# Patient Record
Sex: Male | Born: 1995 | Race: Black or African American | Hispanic: No | Marital: Single | Smoking: Current every day smoker
Health system: Southern US, Community
[De-identification: ages and names within clinical notes are randomized; demographics above are authoritative.]

## PROBLEM LIST (undated history)

## (undated) ENCOUNTER — Ambulatory Visit (HOSPITAL_COMMUNITY): Admission: EM | Payer: Self-pay | Source: Home / Self Care

## (undated) ENCOUNTER — Ambulatory Visit (HOSPITAL_COMMUNITY): Disposition: A | Discharge status: Home / Self Care | Payer: Self-pay

## (undated) DIAGNOSIS — R040 Epistaxis: Secondary | ICD-10-CM

---
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
## 2016-01-02 ENCOUNTER — Emergency Department (HOSPITAL_COMMUNITY)
Admission: EM | Admit: 2016-01-02 | Discharge: 2016-01-02 | Disposition: A | Discharge status: Home / Self Care | Payer: Medicaid Other | Attending: Emergency Medicine | Admitting: Emergency Medicine

## 2016-01-02 ENCOUNTER — Encounter (HOSPITAL_COMMUNITY): Payer: Self-pay | Admitting: Emergency Medicine

## 2016-01-02 DIAGNOSIS — Z202 Contact with and (suspected) exposure to infections with a predominantly sexual mode of transmission: Secondary | ICD-10-CM | POA: Diagnosis not present

## 2016-01-02 LAB — URINALYSIS, ROUTINE W REFLEX MICROSCOPIC
Bilirubin Urine: NEGATIVE
Glucose, UA: NEGATIVE mg/dL
Hgb urine dipstick: NEGATIVE
Ketones, ur: NEGATIVE mg/dL
Nitrite: NEGATIVE
PROTEIN: NEGATIVE mg/dL
Specific Gravity, Urine: 1.015 (ref 1.005–1.030)
pH: 7.5 (ref 5.0–8.0)

## 2016-01-02 LAB — URINE MICROSCOPIC-ADD ON

## 2016-01-02 MED ORDER — LIDOCAINE HCL (PF) 1 % IJ SOLN
5.0000 mL | Freq: Once | INTRAMUSCULAR | Status: AC
  Administered 2016-01-02: 5 mL
  Filled 2016-01-02: qty 5

## 2016-01-02 MED ORDER — AZITHROMYCIN 250 MG PO TABS
1000.0000 mg | ORAL_TABLET | Freq: Once | ORAL | Status: AC
  Administered 2016-01-02: 1000 mg via ORAL
  Filled 2016-01-02: qty 4

## 2016-01-02 MED ORDER — CEFTRIAXONE SODIUM 250 MG IJ SOLR
250.0000 mg | Freq: Once | INTRAMUSCULAR | Status: AC
  Administered 2016-01-02: 250 mg via INTRAMUSCULAR
  Filled 2016-01-02: qty 250

## 2016-01-02 MED ORDER — FEXOFENADINE HCL 60 MG PO TABS: 60.0000 mg | ORAL_TABLET | Freq: Every day | ORAL | 0 refills | Status: DC

## 2016-01-02 MED ORDER — ONDANSETRON 4 MG PO TBDP
8.0000 mg | ORAL_TABLET | Freq: Once | ORAL | Status: AC
  Administered 2016-01-02: 8 mg via ORAL
  Filled 2016-01-02: qty 2

## 2016-01-02 NOTE — ED Notes (Signed)
Pt given snack and beverage. 

## 2016-01-02 NOTE — ED Notes (Signed)
Pt requesting refill on his Allegra.

## 2016-01-02 NOTE — ED Triage Notes (Signed)
Pt was told yesterday that he needed to come in for possible std exposure. Denies any symptoms.

## 2016-01-02 NOTE — ED Provider Notes (Addendum)
[facility]-EMERGENCY DEPT Provider Note   CSN: [id number] Arrival date & time: 01/02/16  1141  By signing my name below, I, Majel HomerPeyton Lee, attest that this documentation has been prepared under the direction and in the presence of non-physician practitioner, Roxy Horsemanobert Angele Wiemann, PA-C. Electronically Signed: Majel HomerPeyton Lee, Scribe. 01/02/2016. 11:57 AM.  History   Chief Complaint Chief Complaint  Patient presents with  . Exposure to STD   The history is provided by the patient. No language interpreter was used.   HPI Comments: Jorge Stephenson is a 20 y.o. male who presents to the Emergency Department with a chief complaint of possible STD exposure to chlamydia. Pt reports he was told of possible exposure to this yesterday and visited the ED for an evaluation. He denies any penile pain, testicular pain, abnormal discharge and masses or sores on his penis.   Past Medical History:  Diagnosis Date  . Epistaxis    There are no active problems to display for this patient.  Past Surgical History:  Procedure Laterality Date  . CLOSED REDUCTION FINGER WITH PERCUTANEOUS PINNING Right 11/05/2013   Procedure: CLOSED REDUCTION FINGER WITH PERCUTANEOUS PINNING ;  Surgeon: Betha LoaKevin Kuzma, MD;  Location: [facility];  Service: Orthopedics;  Laterality: Right;    Home Medications    Prior to Admission medications   Medication Sig Start Date End Date Taking? Authorizing Provider  cetirizine (ZYRTEC ALLERGY) 10 MG tablet Take 1 tablet (10 mg total) by mouth daily. 04/20/15   Tatyana Kirichenko, PA-C  fluticasone (FLONASE) 50 MCG/ACT nasal spray Place 1 spray into both nostrils daily. 04/20/15   Jaynie Crumbleatyana Kirichenko, PA-C  HYDROcodone-acetaminophen (NORCO) 5-325 MG per tablet 1-2 tabs po q6 hours prn pain 11/05/13   Betha LoaKevin Kuzma, MD  ibuprofen (ADVIL,MOTRIN) 600 MG tablet Take 1 tablet (600 mg total) by mouth every 6 (six) hours as needed. 04/20/15   Tatyana Kirichenko, PA-C  ondansetron (ZOFRAN) 4 MG tablet  Take 1 tablet (4 mg total) by mouth every 8 (eight) hours as needed for nausea or vomiting. 07/06/13   Mellody DrownLauren Parker, PA-C  permethrin (ELIMITE) 5 % cream Apply to the body once 02/01/13   Rodolph BongEvan S Corey, MD  pseudoephedrine (SUDAFED) 30 MG tablet Take 1 tablet (30 mg total) by mouth every 4 (four) hours as needed for congestion. 04/20/15   Jaynie Crumbleatyana Kirichenko, PA-C    Family History No family history on file.  Social History Social History  Substance Use Topics  . Smoking status: Never Smoker  . Smokeless tobacco: Not on file  . Alcohol use No     Allergies   Patient has no known allergies.   Review of Systems Review of Systems  Constitutional: Negative for fever.  Genitourinary: Negative for discharge, genital sores, penile pain and testicular pain.   Physical Exam Updated Vital Signs BP 122/75 (BP Location: Left Arm)   Pulse 75   Temp 98 F (36.7 C) (Oral)   Resp 16   SpO2 100%   Physical Exam  Constitutional: He is oriented to person, place, and time. He appears well-developed and well-nourished.  HENT:  Head: Normocephalic and atraumatic.  Eyes: Conjunctivae and EOM are normal.  Neck: Normal range of motion.  Cardiovascular: Normal rate.   Pulmonary/Chest: Effort normal.  Abdominal: He exhibits no distension.  Genitourinary: Penis normal.  Genitourinary Comments: No discharge, masses, lesions, or abnormality  Musculoskeletal: Normal range of motion.  Neurological: He is alert and oriented to person, place, and time.  Skin: Skin is dry.  Psychiatric: He has a normal mood and affect. His behavior is normal. Judgment and thought content normal.  Nursing note and vitals reviewed.    ED Treatments / Results  Labs (all labs ordered are listed, but only abnormal results are displayed) Labs Reviewed - No data to display  EKG  EKG Interpretation None       Radiology No results found.  Procedures Procedures (including critical care time)  Medications  Ordered in ED Medications - No data to display  DIAGNOSTIC STUDIES:  Oxygen Saturation is 100% on RA, normal by my interpretation.    COORDINATION OF CARE:  12:16 PM Discussed treatment plan with pt at bedside and pt agreed to plan.  Initial Impression / Assessment and Plan / ED Course  I have reviewed the triage vital signs and the nursing notes.  Pertinent labs & imaging results that were available during my care of the patient were reviewed by me and considered in my medical decision making (see chart for details).  Clinical Course     Patient with exposure to STD.  WBCs in urine, no dysuria.  Will treat with rocephin and azithro.  I personally performed the services described in this documentation, which was scribed in my presence. The recorded information has been reviewed and is accurate.   Final Clinical Impressions(s) / ED Diagnoses   Final diagnoses:  STD exposure    New Prescriptions Discharge Medication List as of 01/02/2016 12:59 PM       Roxy Horsemanobert Kayton Ripp, PA-C 01/02/16 1355    Rolland PorterMark James, MD 01/23/16 1527    Roxy Horsemanobert Wanetta Funderburke, PA-C 01/31/16 [id number]    Rolland PorterMark James, MD 02/11/16 1137

## 2016-01-03 LAB — GC/CHLAMYDIA PROBE AMP (~~LOC~~) NOT AT ARMC
Chlamydia: POSITIVE — AB
NEISSERIA GONORRHEA: NEGATIVE

## 2016-01-05 ENCOUNTER — Encounter (HOSPITAL_COMMUNITY): Payer: Self-pay | Admitting: Emergency Medicine

## 2017-03-21 ENCOUNTER — Encounter (HOSPITAL_COMMUNITY): Payer: Self-pay | Admitting: Emergency Medicine

## 2017-03-21 ENCOUNTER — Emergency Department (HOSPITAL_COMMUNITY)
Admission: EM | Admit: 2017-03-21 | Discharge: 2017-03-21 | Disposition: A | Discharge status: Home / Self Care | Payer: Self-pay | Attending: Emergency Medicine | Admitting: Emergency Medicine

## 2017-03-21 DIAGNOSIS — S39012A Strain of muscle, fascia and tendon of lower back, initial encounter: Secondary | ICD-10-CM | POA: Insufficient documentation

## 2017-03-21 DIAGNOSIS — Y929 Unspecified place or not applicable: Secondary | ICD-10-CM | POA: Insufficient documentation

## 2017-03-21 DIAGNOSIS — Y9389 Activity, other specified: Secondary | ICD-10-CM | POA: Insufficient documentation

## 2017-03-21 DIAGNOSIS — Y999 Unspecified external cause status: Secondary | ICD-10-CM | POA: Insufficient documentation

## 2017-03-21 DIAGNOSIS — Z79899 Other long term (current) drug therapy: Secondary | ICD-10-CM | POA: Insufficient documentation

## 2017-03-21 DIAGNOSIS — X501XXA Overexertion from prolonged static or awkward postures, initial encounter: Secondary | ICD-10-CM | POA: Insufficient documentation

## 2017-03-21 MED ORDER — KETOROLAC TROMETHAMINE 60 MG/2ML IM SOLN
60.0000 mg | Freq: Once | INTRAMUSCULAR | Status: AC
  Administered 2017-03-21: 60 mg via INTRAMUSCULAR
  Filled 2017-03-21: qty 2

## 2017-03-21 MED ORDER — DEXAMETHASONE SODIUM PHOSPHATE 10 MG/ML IJ SOLN
10.0000 mg | Freq: Once | INTRAMUSCULAR | Status: AC
  Administered 2017-03-21: 10 mg via INTRAMUSCULAR
  Filled 2017-03-21: qty 1

## 2017-03-21 MED ORDER — BACLOFEN 10 MG PO TABS: 10.0000 mg | ORAL_TABLET | Freq: Three times a day (TID) | ORAL | 0 refills | Status: DC

## 2017-03-21 MED ORDER — MELOXICAM 15 MG PO TABS: 15.0000 mg | ORAL_TABLET | Freq: Every day | ORAL | 0 refills | Status: DC

## 2017-03-21 MED FILL — BACLOFEN 10 MG TABS: 10 | 10 days supply | Qty: 30 | Fill #0

## 2017-03-21 MED FILL — MELOXICAM 15 MG TABLET: 15 | 30 days supply | Qty: 30 | Fill #0

## 2017-03-21 NOTE — ED Notes (Signed)
Bed: WTR8 Expected date:  Expected time:  Means of arrival:  Comments: 

## 2017-03-21 NOTE — Discharge Instructions (Signed)
Do not start taking your Mobic until tomorrow. If you need more pain control before that time please take 500-650mg  of tylenol tonight. SEEK IMMEDIATE MEDICAL ATTENTION IF: New numbness, tingling, weakness, or problem with the use of your arms or legs.  Severe back pain not relieved with medications.  Change in bowel or bladder control.  Increasing pain in any areas of the body (such as chest or abdominal pain).  Shortness of breath, dizziness or fainting.  Nausea (feeling sick to your stomach), vomiting, fever, or sweats.

## 2017-03-21 NOTE — ED Triage Notes (Signed)
Per [facility] patient from home for lower back pain that started last night after bending over. Reports pain is worse with movement.

## 2017-03-21 NOTE — ED Provider Notes (Signed)
[facility]-EMERGENCY DEPT Provider Note   CSN: [id number] Arrival date & time: 03/21/17  1029     History   Chief Complaint Chief Complaint  Patient presents with  . Back Pain    HPI Jorge Stephenson is a 22 y.o. male.  Back Pain: Patient presents for presents evaluation of low back problems.  Symptoms have been present for 1 day and include pain in Lumbar region worse on the R (aching in character; 8/10 in severity). Initial inciting event: Patient bent over to reach something and felt his back stiffen up..  Alleviating factors identifiable by patient are staying still . Exacerbating factors identifiable by patient are any movement. Treatments so far initiated by patient: none Previous lower back problems: none. Previous workup: none. Previous treatments: none. Denies weakness, loss of bowel/bladder function or saddle anesthesia. Denies neck stiffness, headache, rash.  Denies fever or recent procedures to back. Pain does not radiate.   HPI  Past Medical History:  Diagnosis Date  . Epistaxis     There are no active problems to display for this patient.   Past Surgical History:  Procedure Laterality Date  . CLOSED REDUCTION FINGER WITH PERCUTANEOUS PINNING Right 11/05/2013   Procedure: CLOSED REDUCTION FINGER WITH PERCUTANEOUS PINNING ;  Surgeon: Betha Loa, MD;  Location: [facility];  Service: Orthopedics;  Laterality: Right;       Home Medications    Prior to Admission medications   Medication Sig Start Date End Date Taking? Authorizing Provider  baclofen (LIORESAL) 10 MG tablet Take 1 tablet (10 mg total) by mouth 3 (three) times daily. 03/21/17   Arthor Captain, PA-C  cetirizine (ZYRTEC ALLERGY) 10 MG tablet Take 1 tablet (10 mg total) by mouth daily. 04/20/15   Kirichenko, Tatyana, PA-C  fexofenadine (ALLEGRA) 60 MG tablet Take 1 tablet (60 mg total) by mouth daily. 01/02/16   Roxy Horseman, PA-C  fluticasone (FLONASE) 50  MCG/ACT nasal spray Place 1 spray into both nostrils daily. 04/20/15   Kirichenko, Lemont Fillers, PA-C  HYDROcodone-acetaminophen (NORCO) 5-325 MG per tablet 1-2 tabs po q6 hours prn pain 11/05/13   Betha Loa, MD  ibuprofen (ADVIL,MOTRIN) 600 MG tablet Take 1 tablet (600 mg total) by mouth every 6 (six) hours as needed. 04/20/15   Kirichenko, Lemont Fillers, PA-C  meloxicam (MOBIC) 15 MG tablet Take 1 tablet (15 mg total) by mouth daily. 03/21/17   Arthor Captain, PA-C  ondansetron (ZOFRAN) 4 MG tablet Take 1 tablet (4 mg total) by mouth every 8 (eight) hours as needed for nausea or vomiting. 07/06/13   Mellody Drown, PA-C  permethrin Verner Mould) 5 % cream Apply to the body once 02/01/13   Rodolph Bong, MD  pseudoephedrine (SUDAFED) 30 MG tablet Take 1 tablet (30 mg total) by mouth every 4 (four) hours as needed for congestion. 04/20/15   Jaynie Crumble, PA-C    Family History No family history on file.  Social History Social History   Tobacco Use  . Smoking status: Never Smoker  . Smokeless tobacco: Never Used  Substance Use Topics  . Alcohol use: No  . Drug use: Yes    Types: Marijuana     Allergies   Patient has no known allergies.   Review of Systems Review of Systems Ten systems reviewed and are negative for acute change, except as noted in the HPI.   Physical Exam Updated Vital Signs BP (!) 122/94 (BP Location: Right Arm)   Pulse 74   Temp 97.8 F (  36.6 C) (Oral)   Resp 16   SpO2 100%   Physical Exam Physical Exam  Nursing note and vitals reviewed. Constitutional: He appears well-developed and well-nourished. No distress.  HENT:  Head: Normocephalic and atraumatic.  Eyes: Conjunctivae normal are normal. No scleral icterus.  Neck: Normal range of motion. Neck supple.  Cardiovascular: Normal rate, regular rhythm and normal heart sounds.   Pulmonary/Chest: Effort normal and breath sounds normal. No respiratory distress.  Abdominal: Soft. There is no tenderness.    Musculoskeletal: He exhibits no edema.  Patient appears to be in mild to moderate pain, antalgic gait noted. Lumbosacral spine area reveals no local tenderness or mass. Painful and reduced LS ROM noted. Straight leg raise is negative. DTR's, motor strength and sensation normal, including heel and toe gait.  Peripheral pulses are palpable. Neurological: He is alert.  Skin: Skin is warm and dry. He is not diaphoretic.  Psychiatric: His behavior is normal.     ED Treatments / Results  Labs (all labs ordered are listed, but only abnormal results are displayed) Labs Reviewed - No data to display  EKG  EKG Interpretation None       Radiology No results found.  Procedures Procedures (including critical care time)  Medications Ordered in ED Medications  ketorolac (TORADOL) injection 60 mg (60 mg Intramuscular Given 03/21/17 1253)  dexamethasone (DECADRON) injection 10 mg (10 mg Intramuscular Given 03/21/17 1253)     Initial Impression / Assessment and Plan / ED Course  I have reviewed the triage vital signs and the nursing notes.  Pertinent labs & imaging results that were available during my care of the patient were reviewed by me and considered in my medical decision making (see chart for details).     Patient with back pain.  No neurological deficits and normal neuro exam.  Patient can walk but states is painful.  No loss of bowel or bladder control.  No concern for cauda equina.  No fever, night sweats, weight loss, h/o cancer, IVDU.  RICE protocol and pain medicine indicated and discussed with patient.    Final Clinical Impressions(s) / ED Diagnoses   Final diagnoses:  Strain of lumbar region, initial encounter    ED Discharge Orders        Ordered    meloxicam (MOBIC) 15 MG tablet  Daily     03/21/17 1259    baclofen (LIORESAL) 10 MG tablet  3 times daily     03/21/17 1259       Arthor CaptainHarris, Jyoti Harju, PA-C 03/21/17 1311    Benjiman CorePickering, Nathan, MD 03/21/17 1649

## 2017-12-25 ENCOUNTER — Other Ambulatory Visit: Payer: Self-pay

## 2017-12-25 ENCOUNTER — Emergency Department (HOSPITAL_COMMUNITY)
Admission: EM | Admit: 2017-12-25 | Discharge: 2017-12-25 | Disposition: A | Discharge status: Home / Self Care | Payer: Self-pay | Attending: Emergency Medicine | Admitting: Emergency Medicine

## 2017-12-25 DIAGNOSIS — Z79899 Other long term (current) drug therapy: Secondary | ICD-10-CM | POA: Insufficient documentation

## 2017-12-25 DIAGNOSIS — G8929 Other chronic pain: Secondary | ICD-10-CM

## 2017-12-25 DIAGNOSIS — M545 Low back pain: Secondary | ICD-10-CM | POA: Insufficient documentation

## 2017-12-25 MED ORDER — METHOCARBAMOL 500 MG PO TABS: 500.0000 mg | ORAL_TABLET | Freq: Two times a day (BID) | ORAL | 0 refills | Status: AC

## 2017-12-25 NOTE — Discharge Instructions (Signed)
You may alternate taking Tylenol and Ibuprofen as needed for pain control. You may take 400-600 mg of ibuprofen every 6 hours and 500-1000 mg of Tylenol every 6 hours. Do not exceed 4000 mg of Tylenol daily as this can lead to liver damage. Also, make sure to take Ibuprofen with meals as it can cause an upset stomach. Do not take other NSAIDs while taking Ibuprofen such as (Aleve, Naprosyn, Aspirin, Celebrex, etc) and do not take more than the prescribed dose as this can lead to ulcers and bleeding in your GI tract. You may use warm and cold compresses to help with your symptoms.   You were given a prescription for Robaxin which is a muscle relaxer.  You should not drive, work, or operate machinery while taking this medication as it can make you very drowsy.    Please follow up with your primary doctor within the next 7-10 days for re-evaluation and further treatment of your symptoms.   Return to the emergency department immediately if you experience any back pain associated with fevers, loss of control of your bowels/bladder, weakness/numbness to your legs, numbness to your groin area, inability to walk, or inability to urinate.    

## 2017-12-25 NOTE — ED Triage Notes (Signed)
Pt reports back pain for yrs. Pt reports sharp lumbosacral w/ and w/o activity
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
## 2018-03-12 ENCOUNTER — Other Ambulatory Visit: Payer: Self-pay

## 2018-03-12 ENCOUNTER — Emergency Department (HOSPITAL_COMMUNITY): Payer: Self-pay

## 2018-03-12 ENCOUNTER — Emergency Department (HOSPITAL_COMMUNITY)
Admission: EM | Admit: 2018-03-12 | Discharge: 2018-03-12 | Disposition: A | Discharge status: Home / Self Care | Payer: Self-pay | Attending: Emergency Medicine | Admitting: Emergency Medicine

## 2018-03-12 ENCOUNTER — Encounter (HOSPITAL_COMMUNITY): Payer: Self-pay | Admitting: Emergency Medicine

## 2018-03-12 DIAGNOSIS — Y939 Activity, unspecified: Secondary | ICD-10-CM | POA: Insufficient documentation

## 2018-03-12 DIAGNOSIS — T1490XA Injury, unspecified, initial encounter: Secondary | ICD-10-CM

## 2018-03-12 DIAGNOSIS — Y999 Unspecified external cause status: Secondary | ICD-10-CM | POA: Insufficient documentation

## 2018-03-12 DIAGNOSIS — S01511A Laceration without foreign body of lip, initial encounter: Secondary | ICD-10-CM | POA: Insufficient documentation

## 2018-03-12 DIAGNOSIS — Y929 Unspecified place or not applicable: Secondary | ICD-10-CM | POA: Insufficient documentation

## 2018-03-12 DIAGNOSIS — Z23 Encounter for immunization: Secondary | ICD-10-CM | POA: Insufficient documentation

## 2018-03-12 LAB — URINALYSIS, ROUTINE W REFLEX MICROSCOPIC
Bacteria, UA: NONE SEEN
Bilirubin Urine: NEGATIVE
Glucose, UA: NEGATIVE mg/dL
Ketones, ur: NEGATIVE mg/dL
LEUKOCYTES UA: NEGATIVE
Nitrite: NEGATIVE
Protein, ur: NEGATIVE mg/dL
Specific Gravity, Urine: 1.023 (ref 1.005–1.030)
pH: 6 (ref 5.0–8.0)

## 2018-03-12 LAB — CBC
HCT: 40.7 % (ref 39.0–52.0)
Hemoglobin: 13.3 g/dL (ref 13.0–17.0)
MCH: 26.8 pg (ref 26.0–34.0)
MCHC: 32.7 g/dL (ref 30.0–36.0)
MCV: 82.1 fL (ref 80.0–100.0)
PLATELETS: 202 10*3/uL (ref 150–400)
RBC: 4.96 MIL/uL (ref 4.22–5.81)
RDW: 13.2 % (ref 11.5–15.5)
WBC: 10.1 10*3/uL (ref 4.0–10.5)
nRBC: 0 % (ref 0.0–0.2)

## 2018-03-12 LAB — BASIC METABOLIC PANEL
Anion gap: 8 (ref 5–15)
BUN: 9 mg/dL (ref 6–20)
CHLORIDE: 108 mmol/L (ref 98–111)
CO2: 25 mmol/L (ref 22–32)
Calcium: 9.3 mg/dL (ref 8.9–10.3)
Creatinine, Ser: 1.32 mg/dL — ABNORMAL HIGH (ref 0.61–1.24)
GFR calc Af Amer: >60 mL/min (ref >60)
Glucose, Bld: 86 mg/dL (ref 70–99)
POTASSIUM: 3.5 mmol/L (ref 3.5–5.1)
Sodium: 141 mmol/L (ref 135–145)

## 2018-03-12 LAB — CK: Total CK: 1666 U/L — ABNORMAL HIGH (ref 49–397)

## 2018-03-12 MED ORDER — IOPAMIDOL (ISOVUE-370) INJECTION 76%
INTRAVENOUS | Status: AC
  Administered 2018-03-12: 75 mL
  Filled 2018-03-12: qty 100

## 2018-03-12 MED ORDER — METHOCARBAMOL 500 MG PO TABS: 500.0000 mg | ORAL_TABLET | Freq: Two times a day (BID) | ORAL | 0 refills | Status: DC

## 2018-03-12 MED ORDER — SODIUM CHLORIDE 0.9 % IV BOLUS
1000.0000 mL | Freq: Once | INTRAVENOUS | Status: AC
  Administered 2018-03-12: 1000 mL via INTRAVENOUS

## 2018-03-12 MED ORDER — LIDOCAINE HCL (PF) 1 % IJ SOLN
5.0000 mL | Freq: Once | INTRAMUSCULAR | Status: AC
Start: 2018-03-12 — End: 2018-03-12
  Administered 2018-03-12: 5 mL
  Filled 2018-03-12: qty 5

## 2018-03-12 MED ORDER — MORPHINE SULFATE (PF) 4 MG/ML IV SOLN
4.0000 mg | Freq: Once | INTRAVENOUS | Status: AC
  Administered 2018-03-12: 4 mg via INTRAVENOUS
  Filled 2018-03-12: qty 1

## 2018-03-12 MED ORDER — TETANUS-DIPHTH-ACELL PERTUSSIS 5-2.5-18.5 LF-MCG/0.5 IM SUSP
0.5000 mL | Freq: Once | INTRAMUSCULAR | Status: AC
  Administered 2018-03-12: 0.5 mL via INTRAMUSCULAR
  Filled 2018-03-12: qty 0.5

## 2018-03-12 MED ORDER — IBUPROFEN 600 MG PO TABS: 600.0000 mg | ORAL_TABLET | Freq: Four times a day (QID) | ORAL | 0 refills | Status: DC | PRN

## 2018-03-12 NOTE — ED Notes (Signed)
Pt ambulatory and tolerating PO intake well at this time.
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
## 2018-03-12 NOTE — ED Notes (Signed)
Updated on wait for treatment room. 
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
## 2018-03-12 NOTE — Discharge Instructions (Signed)
Thank you for allowing me to care for you today in the Emergency Department.   The sutures in your lip should dissolve on their own. If they do not dissolve within one week, you should have them removed.  Swish and spit warm salt water two times daily until the wound on your lip heals to help prevent infection.  Your tetanus immunization was updated today.
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
## 2018-10-05 ENCOUNTER — Other Ambulatory Visit: Payer: Self-pay

## 2018-10-05 ENCOUNTER — Encounter (HOSPITAL_COMMUNITY): Payer: Self-pay | Admitting: Emergency Medicine

## 2018-10-05 ENCOUNTER — Emergency Department (HOSPITAL_COMMUNITY)
Admission: EM | Admit: 2018-10-05 | Discharge: 2018-10-05 | Disposition: A | Discharge status: Home / Self Care | Payer: Self-pay | Attending: Emergency Medicine | Admitting: Emergency Medicine

## 2018-10-05 DIAGNOSIS — F17210 Nicotine dependence, cigarettes, uncomplicated: Secondary | ICD-10-CM | POA: Insufficient documentation

## 2018-10-05 DIAGNOSIS — Z79899 Other long term (current) drug therapy: Secondary | ICD-10-CM | POA: Insufficient documentation

## 2018-10-05 DIAGNOSIS — K029 Dental caries, unspecified: Secondary | ICD-10-CM | POA: Insufficient documentation

## 2018-10-05 DIAGNOSIS — K047 Periapical abscess without sinus: Secondary | ICD-10-CM

## 2018-10-05 MED ORDER — CLINDAMYCIN HCL 150 MG PO CAPS: 300.0000 mg | ORAL_CAPSULE | Freq: Four times a day (QID) | ORAL | 0 refills | Status: DC

## 2018-10-05 MED ORDER — IBUPROFEN 600 MG PO TABS: 600.0000 mg | ORAL_TABLET | Freq: Four times a day (QID) | ORAL | 0 refills | Status: DC | PRN

## 2018-10-05 NOTE — ED Provider Notes (Signed)
[facility]-EMERGENCY DEPT Provider Note   CSN: [id number] Arrival date & time: 10/05/18  0815     History   Chief Complaint Chief Complaint  Patient presents with  . Dental Pain    HPI Worthy KeelerDashon D Sciulli is a 23 y.o. male.     The history is provided by the patient and medical records. No language interpreter was used.  Dental Pain Associated symptoms: facial swelling   Associated symptoms: no fever     Aviv D Antony MaduraRutledge is a 23 y.o. male who presents to the Emergency Department complaining of persistent, gradually worsening, right-sided, upper dental pain beginning 3 days ago. This morning, when he awoke, he had developed right facial swelling. Pt describes their pain as throbbing. Pt has not tried any medications for pain / symptoms. They are not currently followed by dentistry.  Pt denies fever, chills, difficulty breathing, difficulty swallowing.   Past Medical History:  Diagnosis Date  . Epistaxis     There are no active problems to display for this patient.   Past Surgical History:  Procedure Laterality Date  . CLOSED REDUCTION FINGER WITH PERCUTANEOUS PINNING Right 11/05/2013   Procedure: CLOSED REDUCTION FINGER WITH PERCUTANEOUS PINNING ;  Surgeon: Betha LoaKevin Kuzma, MD;  Location: [facility];  Service: Orthopedics;  Laterality: Right;        Home Medications    Prior to Admission medications   Medication Sig Start Date End Date Taking? Authorizing Provider  clindamycin (CLEOCIN) 150 MG capsule Take 2 capsules (300 mg total) by mouth 4 (four) times daily. 10/05/18   Ward, Chase PicketJaime Pilcher, PA-C  ibuprofen (ADVIL) 600 MG tablet Take 1 tablet (600 mg total) by mouth every 6 (six) hours as needed. 10/05/18   Ward, Chase PicketJaime Pilcher, PA-C  methocarbamol (ROBAXIN) 500 MG tablet Take 1 tablet (500 mg total) by mouth 2 (two) times daily. 03/12/18   Fayrene Helperran, [place], PA-C    Family History No family history on file.  Social History Social History    Tobacco Use  . Smoking status: Current Every Day Smoker    Types: Cigarettes  . Smokeless tobacco: Never Used  Substance Use Topics  . Alcohol use: No  . Drug use: Yes    Types: Marijuana     Allergies   Patient has no known allergies.   Review of Systems Review of Systems  Constitutional: Negative for fever.  HENT: Positive for dental problem and facial swelling. Negative for trouble swallowing.   Respiratory: Negative for cough and shortness of breath.      Physical Exam Updated Vital Signs BP (!) 145/92 (BP Location: Right Arm)   Pulse 82   Temp 99.6 F (37.6 C) (Oral)   Resp 15   SpO2 100%   Physical Exam Vitals signs and nursing note reviewed.  Constitutional:      General: He is not in acute distress.    Appearance: He is well-developed.  HENT:     Head: Normocephalic and atraumatic.     Mouth/Throat:      Comments: Dental cavities and poor oral dentition noted. Pain along tooth and associated gumline as depicted in image. No abscess noted. Midline uvula. No trismus. OP moist and clear. No oropharyngeal erythema or edema. Neck supple with no tenderness. Right-sided facial swelling. Eyes:     Comments: EOM's intact and without pain.   Neck:     Musculoskeletal: Neck supple.  Cardiovascular:     Rate and Rhythm: Normal rate and regular rhythm.  Heart sounds: Normal heart sounds. No murmur.  Pulmonary:     Effort: Pulmonary effort is normal. No respiratory distress.     Breath sounds: Normal breath sounds. No wheezing or rales.  Musculoskeletal: Normal range of motion.  Skin:    General: Skin is warm and dry.  Neurological:     Mental Status: He is alert.      ED Treatments / Results  Labs (all labs ordered are listed, but only abnormal results are displayed) Labs Reviewed - No data to display  EKG None  Radiology No results found.  Procedures Procedures (including critical care time)  Medications Ordered in ED Medications - No  data to display   Initial Impression / Assessment and Plan / ED Course  I have reviewed the triage vital signs and the nursing notes.  Pertinent labs & imaging results that were available during my care of the patient were reviewed by me and considered in my medical decision making (see chart for details).       KIMARION CHERY is a 23 y.o. male who presents to ED for dental pain. No abscess requiring immediate incision and drainage. Patient is afebrile, non toxic appearing, and swallowing secretions well. Exam not concerning for Ludwig's angina or pharyngeal abscess. Will treat with Clinda. I provided dental referral and stressed the importance of dental follow up for ultimate management of dental pain. Patient voices understanding and is agreeable to plan.   Final Clinical Impressions(s) / ED Diagnoses   Final diagnoses:  Dental infection    ED Discharge Orders         Ordered    ibuprofen (ADVIL) 600 MG tablet  Every 6 hours PRN     10/05/18 0856    clindamycin (CLEOCIN) 150 MG capsule  4 times daily     10/05/18 0856           Ward, Ozella Almond, PA-C 10/05/18 1751    Milton Ferguson, MD 10/06/18 0715

## 2018-10-05 NOTE — Discharge Instructions (Signed)
You have a dental infection. It is very important that you get evaluated by a dentist as soon as possible. Call tomorrow to schedule an appointment. Ibuprofen as needed for pain. Take your full course of antibiotics. Read the instructions below.  Eat a soft or liquid diet and rinse your mouth out after meals with warm water. You should see a dentist or return here at once if you have increased swelling, increased pain or uncontrolled bleeding from the site of your injury.

## 2018-10-05 NOTE — ED Triage Notes (Addendum)
Pt c/o right side dental pain for about 3 days. Today having swelling to right side of face.

## 2018-10-05 NOTE — ED Notes (Signed)
Discharge paperwork reviewed with pt, including importance of taking antibiotics as prescribed.  Pt reporting that "Im not getting them, I aint got not money for that." This RN emphasized that pts oral swelling is likely d/t swelling and would possibly get worse without antibiotics.  Pt verbalized understanding.  Information regarding dentist referral also reviewed. Pt ambulatory at discharge.

## 2018-11-28 ENCOUNTER — Other Ambulatory Visit: Payer: Self-pay

## 2018-11-28 ENCOUNTER — Emergency Department (HOSPITAL_COMMUNITY)
Admission: EM | Admit: 2018-11-28 | Discharge: 2018-11-28 | Disposition: A | Discharge status: Home / Self Care | Payer: Self-pay | Attending: Emergency Medicine | Admitting: Emergency Medicine

## 2018-11-28 ENCOUNTER — Encounter (HOSPITAL_COMMUNITY): Payer: Self-pay | Admitting: Emergency Medicine

## 2018-11-28 DIAGNOSIS — Y92009 Unspecified place in unspecified non-institutional (private) residence as the place of occurrence of the external cause: Secondary | ICD-10-CM | POA: Insufficient documentation

## 2018-11-28 DIAGNOSIS — Y939 Activity, unspecified: Secondary | ICD-10-CM | POA: Insufficient documentation

## 2018-11-28 DIAGNOSIS — S0101XA Laceration without foreign body of scalp, initial encounter: Secondary | ICD-10-CM | POA: Insufficient documentation

## 2018-11-28 DIAGNOSIS — F17210 Nicotine dependence, cigarettes, uncomplicated: Secondary | ICD-10-CM | POA: Insufficient documentation

## 2018-11-28 DIAGNOSIS — Y999 Unspecified external cause status: Secondary | ICD-10-CM | POA: Insufficient documentation

## 2018-11-28 DIAGNOSIS — Z23 Encounter for immunization: Secondary | ICD-10-CM | POA: Insufficient documentation

## 2018-11-28 MED ORDER — ACETAMINOPHEN 325 MG PO TABS
650.0000 mg | ORAL_TABLET | Freq: Once | ORAL | Status: AC
  Administered 2018-11-28: 650 mg via ORAL
  Filled 2018-11-28: qty 2

## 2018-11-28 MED ORDER — TETANUS-DIPHTH-ACELL PERTUSSIS 5-2.5-18.5 LF-MCG/0.5 IM SUSP
0.5000 mL | Freq: Once | INTRAMUSCULAR | Status: AC
  Administered 2018-11-28: 0.5 mL via INTRAMUSCULAR
  Filled 2018-11-28: qty 0.5
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
## 2018-11-28 NOTE — ED Notes (Signed)
Pt visualized walking out of the ED after he was asking about how long it would take for discharge paperwork.
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
## 2018-11-28 NOTE — Discharge Instructions (Addendum)
I have placed 4 staples to your head, these will need to be removed within 10 days.  You may alternate Tylenol or ibuprofen to help with your pain.  Please keep the staples dry and clean.  You experience any redness, fevers, worsening symptoms please return to the emergency department.

## 2018-11-28 NOTE — ED Notes (Signed)
Patient left without after getting staples prior to completion of the discharge process.

## 2018-12-08 ENCOUNTER — Emergency Department (HOSPITAL_COMMUNITY)
Admission: EM | Admit: 2018-12-08 | Discharge: 2018-12-09 | Discharge status: Absent without leave | Payer: Self-pay | Attending: Emergency Medicine | Admitting: Emergency Medicine

## 2018-12-08 NOTE — ED Notes (Signed)
No answer to call

## 2018-12-08 NOTE — ED Notes (Signed)
I have called for Pt several times. No answer

## 2020-08-11 ENCOUNTER — Other Ambulatory Visit (HOSPITAL_COMMUNITY): Payer: Self-pay

## 2021-01-19 ENCOUNTER — Encounter (HOSPITAL_COMMUNITY): Payer: Self-pay

## 2021-01-19 ENCOUNTER — Emergency Department (HOSPITAL_COMMUNITY)
Admission: EM | Admit: 2021-01-19 | Discharge: 2021-01-19 | Disposition: A | Discharge status: Home / Self Care | Payer: Self-pay | Attending: Emergency Medicine | Admitting: Emergency Medicine

## 2021-01-19 ENCOUNTER — Emergency Department (HOSPITAL_COMMUNITY): Payer: Self-pay

## 2021-01-19 DIAGNOSIS — S1081XA Abrasion of other specified part of neck, initial encounter: Secondary | ICD-10-CM | POA: Insufficient documentation

## 2021-01-19 DIAGNOSIS — F17210 Nicotine dependence, cigarettes, uncomplicated: Secondary | ICD-10-CM | POA: Insufficient documentation

## 2021-01-19 DIAGNOSIS — S20219A Contusion of unspecified front wall of thorax, initial encounter: Secondary | ICD-10-CM | POA: Insufficient documentation

## 2021-01-19 DIAGNOSIS — S298XXA Other specified injuries of thorax, initial encounter: Secondary | ICD-10-CM

## 2021-01-19 DIAGNOSIS — S41052A Open bite of left shoulder, initial encounter: Secondary | ICD-10-CM | POA: Insufficient documentation

## 2021-01-19 DIAGNOSIS — T07XXXA Unspecified multiple injuries, initial encounter: Secondary | ICD-10-CM

## 2021-01-19 DIAGNOSIS — T1490XA Injury, unspecified, initial encounter: Secondary | ICD-10-CM

## 2021-01-19 DIAGNOSIS — Z23 Encounter for immunization: Secondary | ICD-10-CM | POA: Insufficient documentation

## 2021-01-19 DIAGNOSIS — M549 Dorsalgia, unspecified: Secondary | ICD-10-CM | POA: Insufficient documentation

## 2021-01-19 DIAGNOSIS — T148XXA Other injury of unspecified body region, initial encounter: Secondary | ICD-10-CM

## 2021-01-19 DIAGNOSIS — S0001XA Abrasion of scalp, initial encounter: Secondary | ICD-10-CM | POA: Insufficient documentation

## 2021-01-19 MED ORDER — AMOXICILLIN 500 MG PO CAPS: 500.0000 mg | ORAL_CAPSULE | Freq: Two times a day (BID) | ORAL | 0 refills | Status: AC

## 2021-01-19 MED ORDER — HYDROCODONE-ACETAMINOPHEN 5-325 MG PO TABS
1.0000 | ORAL_TABLET | Freq: Once | ORAL | Status: AC
  Administered 2021-01-19: 1 via ORAL
  Filled 2021-01-19: qty 1

## 2021-01-19 MED ORDER — IBUPROFEN 200 MG PO TABS
400.0000 mg | ORAL_TABLET | Freq: Once | ORAL | Status: AC
  Administered 2021-01-19: 400 mg via ORAL
  Filled 2021-01-19: qty 2

## 2021-01-19 MED ORDER — TETANUS-DIPHTH-ACELL PERTUSSIS 5-2.5-18.5 LF-MCG/0.5 IM SUSY
0.5000 mL | PREFILLED_SYRINGE | Freq: Once | INTRAMUSCULAR | Status: AC
  Administered 2021-01-19: 0.5 mL via INTRAMUSCULAR
  Filled 2021-01-19: qty 0.5
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
## 2022-02-13 ENCOUNTER — Emergency Department (HOSPITAL_COMMUNITY)
Admission: EM | Admit: 2022-02-13 | Discharge: 2022-02-13 | Disposition: A | Discharge status: Home / Self Care | Payer: 59 | Attending: Emergency Medicine | Admitting: Emergency Medicine

## 2022-02-13 ENCOUNTER — Other Ambulatory Visit: Payer: Self-pay

## 2022-02-13 DIAGNOSIS — K0889 Other specified disorders of teeth and supporting structures: Secondary | ICD-10-CM | POA: Diagnosis not present

## 2022-02-13 DIAGNOSIS — K047 Periapical abscess without sinus: Secondary | ICD-10-CM | POA: Diagnosis not present

## 2022-02-13 DIAGNOSIS — K029 Dental caries, unspecified: Secondary | ICD-10-CM | POA: Diagnosis not present

## 2022-02-13 MED ORDER — OXYCODONE-ACETAMINOPHEN 5-325 MG PO TABS
1.0000 | ORAL_TABLET | Freq: Once | ORAL | Status: AC
  Administered 2022-02-13: 1 via ORAL
  Filled 2022-02-13: qty 1

## 2022-02-13 MED ORDER — AMOXICILLIN-POT CLAVULANATE 875-125 MG PO TABS: 1.0000 | ORAL_TABLET | Freq: Two times a day (BID) | ORAL | 0 refills | Status: AC

## 2022-02-13 NOTE — ED Triage Notes (Signed)
3 days left bottom tooth pain with swelling noted.

## 2022-02-13 NOTE — ED Provider Notes (Signed)
[facility] Provider Note   CSN: [id number] Arrival date & time: 02/13/22  1320     History  Chief Complaint  Patient presents with   Dental Pain    Jorge Stephenson is a 26 y.o. male with Dr. Urban Gibson past medical history, known broken tooth on the lower left who presents with concern for 3 days of left tooth pain, swelling, concern for abscessed tooth.  Patient reports that he has been taking ibuprofen, sometimes taking up to 1000 mg at a time.  Patient reports no Tylenol usage.  Patient reports that he has dental appointment coming up but has not seen a dentist before.   Dental Pain      Home Medications Prior to Admission medications   Medication Sig Start Date End Date Taking? Authorizing Provider  amoxicillin-clavulanate (AUGMENTIN) 875-125 MG tablet Take 1 tablet by mouth every 12 (twelve) hours. 02/13/22  Yes Ghassan Coggeshall H, PA-C  amoxicillin (AMOXIL) 500 MG capsule Take 1 capsule (500 mg total) by mouth 2 (two) times daily. 01/19/21   Zadie Rhine, MD      Allergies    Patient has no known allergies.    Review of Systems   Review of Systems  All other systems reviewed and are negative.   Physical Exam Updated Vital Signs BP 101/82 (BP Location: Left Arm)   Pulse 89   Temp 99.1 F (37.3 C) (Oral)   Resp 16   Ht 6\' 3"  (1.905 m)   SpO2 99%   BMI 19.60 kg/m  Physical Exam Vitals and nursing note reviewed.  Constitutional:      General: He is not in acute distress.    Appearance: Normal appearance.  HENT:     Head: Normocephalic and atraumatic.     Mouth/Throat:     Comments: No significant posterior oropharynx erythema, swelling, exudate. Uvula midline, tonsils 1+ bilaterally.  No trismus, stridor, evidence of PTA, floor of mouth swelling or redness.   Multiple broken teeth on lower left, some soft tissue swelling, redness without evident abscess collection noted. Eyes:     General:        Right eye: No  discharge.        Left eye: No discharge.  Cardiovascular:     Rate and Rhythm: Normal rate and regular rhythm.  Pulmonary:     Effort: Pulmonary effort is normal. No respiratory distress.  Musculoskeletal:        General: No deformity.  Skin:    General: Skin is warm and dry.  Neurological:     Mental Status: He is alert and oriented to person, place, and time.  Psychiatric:        Mood and Affect: Mood normal.        Behavior: Behavior normal.     ED Results / Procedures / Treatments   Labs (all labs ordered are listed, but only abnormal results are displayed) Labs Reviewed - No data to display  EKG None  Radiology No results found.  Procedures Procedures    Medications Ordered in ED Medications  oxyCODONE-acetaminophen (PERCOCET/ROXICET) 5-325 MG per tablet 1 tablet (has no administration in time range)    ED Course/ Medical Decision Making/ A&P                           Medical Decision Making Risk Prescription drug management.    This an overall well-appearing 26 y.o. male who presents with concern for  dental pain.  Physical exam reveals cavities, broken teeth in mouth on lower left.  Patient with redness, gum swelling without evidence of gum abscess, periapical abscess, PTA, uvula deviation, pharyngitis, epiglottitis, dysphonia, stridor.  Patient without difficulty swallowing.  No systemic fever, chills.  Given patient's symptoms think it is reasonable to treat with antibiotics.  Encouraged ibuprofen, Tylenol, Orajel, ice for pain control. Considered stronger pain control with percocet x1, think this is reasonable for this patient's current presentation. Encouraged urgent dental follow-up, dental resource guide provided.  Provided Augmentin for antibiotic coverage.  Patient discharged in stable condition at this time, return precautions given.  Final Clinical Impression(s) / ED Diagnoses Final diagnoses:  Pain, dental  Pain due to dental caries  Dental  infection    Rx / DC Orders ED Discharge Orders          Ordered    amoxicillin-clavulanate (AUGMENTIN) 875-125 MG tablet  Every 12 hours        02/13/22 1638              Culver Feighner, [place], PA-C 02/13/22 1645    Jacalyn Lefevre, MD 02/13/22 1721

## 2022-02-13 NOTE — Discharge Instructions (Addendum)
Please use Tylenol or ibuprofen for pain.  You may use 600 mg ibuprofen every 6 hours or 1000 mg of Tylenol every 6 hours.  You may choose to alternate between the 2.  This would be most effective.  Not to exceed 4 g of Tylenol within 24 hours.  Not to exceed 3200 mg ibuprofen 24 hours.  In addition you can use some OTC orajel.
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
## 2023-07-07 ENCOUNTER — Emergency Department (HOSPITAL_COMMUNITY): Payer: Self-pay

## 2023-07-07 ENCOUNTER — Emergency Department (HOSPITAL_COMMUNITY)
Admission: EM | Admit: 2023-07-07 | Discharge: 2023-07-07 | Discharge status: Against Medical Advice | Payer: Self-pay | Attending: Emergency Medicine | Admitting: Emergency Medicine

## 2023-07-07 ENCOUNTER — Other Ambulatory Visit: Payer: Self-pay

## 2023-07-07 ENCOUNTER — Encounter (HOSPITAL_COMMUNITY): Payer: Self-pay

## 2023-07-07 DIAGNOSIS — W19XXXA Unspecified fall, initial encounter: Secondary | ICD-10-CM | POA: Insufficient documentation

## 2023-07-07 DIAGNOSIS — M25511 Pain in right shoulder: Secondary | ICD-10-CM | POA: Insufficient documentation

## 2023-07-07 DIAGNOSIS — Z5321 Procedure and treatment not carried out due to patient leaving prior to being seen by health care provider: Secondary | ICD-10-CM | POA: Insufficient documentation

## 2023-07-07 DIAGNOSIS — Y9361 Activity, american tackle football: Secondary | ICD-10-CM | POA: Insufficient documentation

## 2023-07-07 DIAGNOSIS — R0789 Other chest pain: Secondary | ICD-10-CM | POA: Insufficient documentation

## 2023-07-07 LAB — BASIC METABOLIC PANEL WITH GFR
Anion gap: 10 (ref 5–15)
BUN: <5 mg/dL — ABNORMAL LOW (ref 6–20)
CO2: 22 mmol/L (ref 22–32)
Calcium: 9.3 mg/dL (ref 8.9–10.3)
Chloride: 109 mmol/L (ref 98–111)
Creatinine, Ser: 1.03 mg/dL (ref 0.61–1.24)
GFR, Estimated: >60 mL/min (ref >60)
Glucose, Bld: 85 mg/dL (ref 70–99)
Potassium: 3.9 mmol/L (ref 3.5–5.1)
Sodium: 141 mmol/L (ref 135–145)

## 2023-07-07 LAB — CBC
HCT: 41.3 % (ref 39.0–52.0)
Hemoglobin: 13 g/dL (ref 13.0–17.0)
MCH: 26.4 pg (ref 26.0–34.0)
MCHC: 31.5 g/dL (ref 30.0–36.0)
MCV: 83.9 fL (ref 80.0–100.0)
Platelets: 239 10*3/uL (ref 150–400)
RBC: 4.92 MIL/uL (ref 4.22–5.81)
RDW: 15.3 % (ref 11.5–15.5)
WBC: 5.3 10*3/uL (ref 4.0–10.5)
nRBC: 0 % (ref 0.0–0.2)

## 2023-07-07 LAB — TROPONIN I (HIGH SENSITIVITY)
Troponin I (High Sensitivity): 4 ng/L (ref <18)
Troponin I (High Sensitivity): 4 ng/L (ref <18)

## 2023-07-07 NOTE — ED Triage Notes (Signed)
 Arrived POV form home. Patient reports playing football yesterday injuring right shoulder and right thumb. Patient reports chest pain started 3 days ago. Decreased ROM with right shoulder and right thumb. Patient states when he lifts his shoulder pain radiates roward neck.

## 2023-07-07 NOTE — ED Provider Triage Note (Signed)
 Emergency Medicine Provider Triage Evaluation Note  Jorge Stephenson , a 28 y.o. male  was evaluated in triage.  Pt complains of right shoulder pain and chest pain.  Plan football yesterday and fell now with right shoulder pain extending to the right chest wall.  Also endorses central chest pain that started 3 days ago.  Denies shortness of breath.  Review of Systems  Positive: See above Negative: See above  Physical Exam  BP 100/86   Pulse 77   Temp 98.6 F (37 C) (Oral)   Resp 16   Ht 6\' 3"  (1.905 m)   Wt 74.8 kg   SpO2 100%   BMI 20.62 kg/m  Gen:   Awake, no distress   Resp:  Normal effort  MSK:   Moves extremities without difficulty  Other:    Medical Decision Making  Medically screening exam initiated at 12:07 PM.  Appropriate orders placed.  Letitia Ravens was informed that the remainder of the evaluation will be completed by another provider, this initial triage assessment does not replace that evaluation, and the importance of remaining in the ED until their evaluation is complete.  Work up started   Janalee Mcmurray, PA-C 07/07/23 1207

## 2023-10-12 ENCOUNTER — Emergency Department (HOSPITAL_COMMUNITY)

## 2023-10-12 ENCOUNTER — Other Ambulatory Visit: Payer: Self-pay

## 2023-10-12 ENCOUNTER — Emergency Department (HOSPITAL_COMMUNITY)
Admission: EM | Admit: 2023-10-12 | Discharge: 2023-10-12 | Disposition: A | Discharge status: Home / Self Care | Attending: Emergency Medicine | Admitting: Emergency Medicine

## 2023-10-12 ENCOUNTER — Encounter (HOSPITAL_COMMUNITY): Payer: Self-pay

## 2023-10-12 DIAGNOSIS — W231XXA Caught, crushed, jammed, or pinched between stationary objects, initial encounter: Secondary | ICD-10-CM | POA: Diagnosis not present

## 2023-10-12 DIAGNOSIS — Y9361 Activity, american tackle football: Secondary | ICD-10-CM | POA: Insufficient documentation

## 2023-10-12 DIAGNOSIS — S62623A Displaced fracture of medial phalanx of left middle finger, initial encounter for closed fracture: Secondary | ICD-10-CM | POA: Diagnosis not present

## 2023-10-12 DIAGNOSIS — S6992XA Unspecified injury of left wrist, hand and finger(s), initial encounter: Secondary | ICD-10-CM | POA: Diagnosis present

## 2023-10-12 DIAGNOSIS — S63639A Sprain of interphalangeal joint of unspecified finger, initial encounter: Secondary | ICD-10-CM

## 2023-10-12 MED ORDER — NAPROXEN 375 MG PO TABS: 375.0000 mg | ORAL_TABLET | Freq: Two times a day (BID) | ORAL | 0 refills | Status: AC

## 2023-10-12 MED ORDER — IBUPROFEN 800 MG PO TABS
800.0000 mg | ORAL_TABLET | Freq: Once | ORAL | Status: AC
  Administered 2023-10-12: 800 mg via ORAL
  Filled 2023-10-12: qty 1

## 2023-10-12 NOTE — Progress Notes (Signed)
 Orthopedic Tech Progress Note Patient Details:  LYNNWOOD BECKFORD [birth year] [id number]  Ortho Devices Type of Ortho Device: Short arm splint, Arm sling Ortho Device/Splint Location: lue long dorsal splint Ortho Device/Splint Interventions: Ordered, Application, Adjustment   Post Interventions Patient Tolerated: Well Instructions Provided: Care of device, Adjustment of device  Chandra Dorn PARAS 10/12/2023, 10:38 PM

## 2023-10-12 NOTE — ED Triage Notes (Addendum)
 Left hand injury today while playing football. Pt is unable to make a fist, unable to touch fourth and third finger to thumb. Pulses intact, reports second, third, fourth finger feel numb.

## 2023-10-12 NOTE — Discharge Instructions (Signed)
###   Volar Plate Fracture Care     A volar plate fracture is an injury to a strong ligament on the palm side of the middle joint of your finger (the proximal interphalangeal joint, or PIP). This injury often happens when the finger is bent backward too far, such as during sports or a fall.      **Treatment and Splinting**      Your finger has been placed in a dorsal block splint. This type of splint keeps your finger from straightening too much, which protects the healing ligament and bone. The splint is usually set so your finger is either straight or slightly bent, depending on your injury. Research shows that splinting in a neutral (straight) position can help prevent stiffness and allow you to return to normal activities sooner, with fewer therapy visits needed.[1][2][3]      **Pain Management**      Nonsteroidal anti-inflammatory drugs (NSAIDs), such as ibuprofen  or naproxen , are recommended to help manage pain and swelling. These medications are commonly used for hand injuries and are supported by clinical guidelines for musculoskeletal pain.[4] Always follow the dosing instructions on the package or as directed by your healthcare provider, and let your doctor know if you have any history of stomach ulcers, kidney problems, or other concerns before using NSAIDs.      **Activity and Recovery**      - Keep your splint on as directed. Do not remove it unless your doctor or therapist tells you to.      - You may be encouraged to gently move the tip and base of your finger to prevent stiffness, but do not bend the middle joint unless instructed.      - Elevate your hand to reduce swelling.      - Avoid activities that could re-injure your finger.      **Follow-Up**      It is important to follow up with an orthopedic hand specialist. They will monitor your healing, adjust your splint if needed, and guide your rehabilitation. Most patients recover well with this approach, but regular  check-ins help prevent complications like stiffness or deformity.[1][2][3]      **When to Seek Help**      Contact your healthcare provider if you notice:      - Increased pain, swelling, or redness      - Numbness or tingling in your finger      - The splint feels too tight or loose      - Difficulty moving your finger after splint removal      **Summary**      With proper splinting, pain control, and specialist follow-up, most people with volar plate fractures of the finger heal well and regain normal function.[1][2][3][4]      ### References  1. Dorsal Block Splinting of Volar Plate Injuries at Neutral Position. Taft NASH, Seifman MA, Moishe KATHEE Alva SHAUNNA Debby DJ. Annals of Plastic Surgery. 2019;82(5):520-522. doi:10.1097/SAP.0000000000001820. 2. Management of Stable Proximal Interphalangeal Joint Volar Plate Injuries With Figure-of-8 Orthoses: A Parallel-Group Randomized Controlled Trial. Dickie HERO, Carra K, Barrett S, McKinstry C. Journal of Hand Therapy : Official Journal of the American Society of Hand Therapists. 2024 Jul-Sep;37(3):363-370. doi:10.1016/j.jht.2023.11.001. 3. Conservative Treatment of Stable Volar Plate Injuries of the Proximal Interphalangeal Joint in Children and Adolescents: A Prospective Study. Weber DM, Kellenberger CJ, Meuli M. Pediatric Emergency Care. 2009;25(9):547-9. doi:10.1097/PEC.[id number]. 4. Common Hand Conditions: A Review. Shepard SKENE, Tadisina KK, Mack[place]. JAMA. 2022;327(24):2434-2445. doi:10.1001/jama.7977.1518.

## 2023-10-12 NOTE — ED Notes (Signed)
 Ortho tech called

## 2023-10-12 NOTE — ED Provider Notes (Signed)
  EMERGENCY DEPARTMENT AT [facility] Provider Note   CSN: [id number] Arrival date & time: 10/12/23  2024     Patient presents with: Hand Injury   Jorge Stephenson is a 27 y.o. male who presents to the emergency department chief complaint of left hand and fingers injury.  Patient was playing a pickup game of football today when he went in for a tackle.  He jammed his left fingers.  He complains of severe pain in his index and middle finger.  He states that they appeared to be deformed but are now in alignment.  He did not reduce his fingers.  He complains of some numbness and tingling in his index finger.  He is having trouble moving those 2 fingers at all.  He denies any significant numbness tingling stiffness or pain in his ring and pinky finger on the left hand.  He is right-hand dominant.    Hand Injury      Prior to Admission medications   Medication Sig Start Date End Date Taking? Authorizing Provider  amoxicillin  (AMOXIL ) 500 MG capsule Take 1 capsule (500 mg total) by mouth 2 (two) times daily. 01/19/21   Midge Golas, MD  amoxicillin -clavulanate (AUGMENTIN ) 875-125 MG tablet Take 1 tablet by mouth every 12 (twelve) hours. 02/13/22   Prosperi, Christian H, PA-C    Allergies: Patient has no known allergies.    Review of Systems  Updated Vital Signs BP (!) 151/95 (BP Location: Right Arm)   Pulse 83   Temp 98.4 F (36.9 C) (Oral)   Resp 16   Ht 6' 2 (1.88 m)   Wt 74.8 kg   SpO2 100%   BMI 21.18 kg/m   Physical Exam Vitals and nursing note reviewed.  Constitutional:      General: He is not in acute distress.    Appearance: He is well-developed. He is not diaphoretic.  HENT:     Head: Normocephalic and atraumatic.  Eyes:     General: No scleral icterus.    Conjunctiva/sclera: Conjunctivae normal.  Cardiovascular:     Rate and Rhythm: Normal rate and regular rhythm.     Heart sounds: Normal heart sounds.  Pulmonary:     Effort:  Pulmonary effort is normal. No respiratory distress.     Breath sounds: Normal breath sounds.  Abdominal:     Palpations: Abdomen is soft.     Tenderness: There is no abdominal tenderness.  Musculoskeletal:     Cervical back: Normal range of motion and neck supple.     Comments: Significant stiffness and swelling in the left index and middle finger limit physical examination.  Patient unable to move his left index finger due to severe pain and stiffness.  Patient does not have any obvious deformities.  He has normal sensation to light touch and cap refill less than 2 seconds.  Patient has some movement of the left middle finger but pain with movement of the fractional intra phalangeal joint.  He has full range of motion of his thumb.  Normal sensation and no pain in the 4th and 5th digits on the left hand.  Normal ipsilateral wrist and elbow examination.  Radial pulse 2+  Skin:    General: Skin is warm and dry.  Neurological:     Mental Status: He is alert.  Psychiatric:        Behavior: Behavior normal.     (all labs ordered are listed, but only abnormal results are displayed) Labs Reviewed -  No data to display  EKG: None  Radiology: DG Hand Complete Left Result Date: 10/12/2023 CLINICAL DATA:  Pain after injury. Injury playing football. Index, middle, and ring fingers feel numb. EXAM: LEFT HAND - COMPLETE 3+ VIEW COMPARISON:  None Available. FINDINGS: Minimally displaced fracture of the volar plate of the middle finger of the middle phalanx at the proximal interphalangeal joint. No additional fracture. The alignment and joint spaces are preserved. Mild soft tissue edema of the index finger. IMPRESSION: Minimally displaced volar plate fracture of the middle finger middle phalanx. Electronically Signed   By: Andrea Gasman M.D.   On: 10/12/2023 21:04     Procedures   Medications Ordered in the ED - No data to display                                  Medical Decision  Making Amount and/or Complexity of Data Reviewed Radiology: ordered.  Risk Prescription drug management.   28 year old male who presents emergency department with left finger injury.  Differential diagnosis includes dislocation, fracture, tendon or ligament injury.\ I visualized and interpreted left hand injury that shows a volar plate injury of the left middle finger. Patient placed in dorsal block splint on the left hand, sling for comfort.  NSAIDs and anti-inflammatories.  Follow-up with orthopedics.  Discussed to seek immediate medical care in the emergency department.     Final diagnoses:  Volar plate injury of finger, initial encounter    ED Discharge Orders     None          Arloa Chroman, PA-C 10/12/23 2258    Dean Clarity, MD 10/12/23 2258

## 2023-10-16 NOTE — Progress Notes (Signed)
 Plastic and Reconstructive Surgery Clinic Note  Chief Complaint: No chief complaint on file.    Referred by: Jorge Stephenson  History of Present Illness:  Subjective  Jorge Stephenson is a 28 y.o. male who presents for left IF/MF injury sustained on 10/12/23 while playing football.  He subsequently presented to the local ED where he was told he had a volar plate avulsion fracture. He was placed in alumifoam splints and referred today. He notes continued pain and swelling, denies numbness and tingling. His symptoms are improved with elevation     Past Medical History:  Medical History[1]  Problem List Problem List[2]  Past Surgical History: Surgical History[3]  Social History: Social History   Tobacco Use  . Smoking status: Every Day    Types: Cigarettes    Passive exposure: Current  . Smokeless tobacco: Never  Substance Use Topics  . Alcohol use: Not on file    Family History: family history is not on file.  Allergies: Allergies[4]  Medications: currently has no medications in their medication list.  Review of Systems: A 14 point review of systems was obtained and negative unless otherwise stated in the HPI  Physical Examination:  temporal temperature is 97.6 F (36.4 C). His blood pressure is 127/67 and his pulse is 67.  There is no height or weight on file to calculate BMI.  Gen: well appearing male in no acute distress Psych: alert, normal mood/affect Neuro: follows commands, CN VII function symmetric Eyes: pupils equal, round ENT:  mucous membranes moist and pink Resp: breathing comfortably on room air, non-labored, no distress CV: no clubbing, no cyanosis Skin: warm, no systemic rashes Extremity: TTP overlying left IF volar lip of P2, TTP circumferentially around left MF PIP Sensation: LT sensation present M/R/U Vascular: Brisk capillary refill     Imaging 3 Views Left Hand 10/16/23  Mildly displaced volar plate avulsion fracture of left index finger  middle phalanx base  Assessment and Plan:  Problem List Items Addressed This Visit   None Visit Diagnoses       Injury of left hand, initial encounter    -  Primary   Relevant Orders   XR Hand Minimum 3 Views Left      27yo M with left IF P2 volar plate avulsion fracture and left MF PIP ligament sprain -The clinical course and relevant anatomy pertaining to the patients diagnosis were discussed in detail, as well as potential treatment options and the associated risks/benefits of each.  He was placed into a cast today as he admitted anything less than a cast and he would remove it.  He will follow up in 4 weeks for repeat evaluation of fracture healing   Jorge JULIANNA Hock, MD  Plastic & Reconstructive Surgery Hand & Upper Extremity Surgery [facility] 10/16/2023 12:45 PM         [1] No past medical history on file. [2] There is no problem list on file for this patient. [3] No past surgical history on file. [4] No Known Allergies

## 2023-10-17 NOTE — Telephone Encounter (Signed)
 Patient was seen yesterday and a cast was placed.  The cast got wet at work.  Patient was also concerned about how much his fingers were moving in the cast.

## 2023-10-18 ENCOUNTER — Emergency Department (HOSPITAL_COMMUNITY)
Admission: EM | Admit: 2023-10-18 | Discharge: 2023-10-19 | Discharge status: Against Medical Advice | Attending: Emergency Medicine | Admitting: Emergency Medicine

## 2023-10-18 ENCOUNTER — Encounter (HOSPITAL_COMMUNITY): Payer: Self-pay | Admitting: Emergency Medicine

## 2023-10-18 DIAGNOSIS — Z4789 Encounter for other orthopedic aftercare: Secondary | ICD-10-CM | POA: Insufficient documentation

## 2023-10-18 DIAGNOSIS — Z4689 Encounter for fitting and adjustment of other specified devices: Secondary | ICD-10-CM | POA: Diagnosis present

## 2023-10-18 DIAGNOSIS — Z5321 Procedure and treatment not carried out due to patient leaving prior to being seen by health care provider: Secondary | ICD-10-CM | POA: Diagnosis not present

## 2023-10-18 NOTE — Progress Notes (Addendum)
 Orthopedic Tech Progress Note Patient Details:  Jorge Stephenson April 11, 1995 [id number]  Patient ID: [name], male   DOB: 1995/10/22, 28 y.o.   MRN: [id number] I was called to come check on a cast and to replace it. When I arrived the cast had already been removed by the pt and was in the trash can. What was there appeared to be the fingers of either an ulnar or radial gutter. I told the Rn that I needed an order to do a new cast and an order for what kind of cast they wanted. The pt heard my request and asked me to tell the them all of the types of cast there were. Seeming to insinuate that they could tell me what kind of cast was on and what they wanted. I explained that there were hundreds of different types of casts, but that I could only apply what was ordered by the Dr. I told them that I needed to go get the cast materials. When I returned the Dr had arrived and was explaining to the pt that they could get a splint but would need to go to the ortho dr to get a new cast. The pt was not happy about this. The pt started saying that we, the dr and I were going to lose our jobs because of this. Then they started saying that if something went wrong with their hand and if they didn't heal right that it would be our fault and we would get sued and lose our job. He was saying he was told by a different dr that he had to have a cast and that a splint wouldn't do the job and come off in their sleep. I explained that I could only do what was ordered by the dr and was willing to apply the splint. They said that was ok. So I started prepping the things to apply a radial gutter as was ordered. As I was getting set up the pt continued to make claims of the dr and I getting in trouble and getting fired. Then the pt said they were going to get a lawyer and sue the dr and I. I then felt unsafe splinting this pt as the threats felt real. And I told the pt I felt unsafe because of this and someone else would have  to apply the splint. The pt got very upset gathered their things and left.  Chandra Dorn PARAS 10/18/2023, 11:55 PM

## 2023-10-18 NOTE — ED Triage Notes (Signed)
 Pt here from home with a busted cast to the left hand , states that it busted at work

## 2023-10-18 NOTE — Discharge Instructions (Signed)
 Follow up with your orthopedic doctor. Call the office on Monday.

## 2023-10-18 NOTE — ED Provider Notes (Cosign Needed)
 [place] EMERGENCY DEPARTMENT AT [facility] Provider Note   CSN: [id number] Arrival date & time: 10/18/23  2049     Patient presents with: No chief complaint on file.   Jorge Stephenson is a 28 y.o. male.   28 year old male presents emergency room with request for replacement of his cast.  He states that he has a volar plate fracture and he wants a new cast after he removed his own cast at home when it got wet.       Prior to Admission medications   Medication Sig Start Date End Date Taking? Authorizing Provider  amoxicillin  (AMOXIL ) 500 MG capsule Take 1 capsule (500 mg total) by mouth 2 (two) times daily. 01/19/21   Midge Golas, MD  amoxicillin -clavulanate (AUGMENTIN ) 875-125 MG tablet Take 1 tablet by mouth every 12 (twelve) hours. 02/13/22   Prosperi, Christian H, PA-C  naproxen  (NAPROSYN ) 375 MG tablet Take 1 tablet (375 mg total) by mouth 2 (two) times daily with a meal. 10/12/23   Arloa Chroman, PA-C    Allergies: Patient has no known allergies.    Review of Systems Level 5 caveat for patient refusing to answer questions Updated Vital Signs BP 125/78 (BP Location: Right Arm)   Pulse 71   Temp 98.8 F (37.1 C)   Resp 18   SpO2 98%   Physical Exam Vitals and nursing note reviewed.  Constitutional:      General: He is not in acute distress.    Appearance: He is well-developed. He is not diaphoretic.  HENT:     Head: Normocephalic and atraumatic.  Pulmonary:     Effort: Pulmonary effort is normal.  Musculoskeletal:     Comments: Refuses exam  Neurological:     Mental Status: He is alert and oriented to person, place, and time.  Psychiatric:        Behavior: Behavior normal.     (all labs ordered are listed, but only abnormal results are displayed) Labs Reviewed - No data to display  EKG: None  Radiology: No results found.   Procedures   Medications Ordered in the ED - No data to display                                   Medical Decision Making   Review of records in care everywhere.  Patient was seen by orthopedics on 10/16/2023 following ER visit 10/12/2023 Imaging 3 Views Left Hand 10/16/23  Mildly displaced volar plate avulsion fracture of left index finger middle phalanx base   Patient refuses exam today.  He states that he just wants a new cast.  I requested time to review patient's records to see exactly what his injury was and what sort of cast he was placed in.  Patient is aggressive and hostile, refuses and states that it is just a cast I need a new cast.  Patient presents part of a fiberglass cast which he is able to slide hit index and middle fingers into.  Plan discussed with ortho tech for radial gutter splint.  Advised patient we can place a radial gutter splint and he can follow up with his orthopedic surgeon. Patient angry, initially refuses and threatens to sue staff. Advised patient we would not tolerate threats, he can be splinted or refuse the splint and will be dc to follow up.   Patient ultimately walked out without the splint.  Final diagnoses:  Aftercare for cast or splint check or change    ED Discharge Orders     None          Beverley Leita LABOR, PA-C 10/18/23 2352    Beverley Leita LABOR, PA-C 10/19/23 0038    Griselda Norris, MD 10/19/23 [phone]

## 2023-10-18 NOTE — Progress Notes (Incomplete)
 Orthopedic Tech Progress Note Patient Details:  Jorge Stephenson 03/17/1995 [id number]  Patient ID: Jorge Stephenson, male   DOB: 1996/02/12, 28 y.o.   MRN: [id number]  Jorge Stephenson 10/18/2023, 11:55 PM

## 2023-10-19 ENCOUNTER — Encounter (HOSPITAL_COMMUNITY): Payer: Self-pay | Admitting: Emergency Medicine

## 2023-10-19 ENCOUNTER — Emergency Department (HOSPITAL_COMMUNITY)
Admission: EM | Admit: 2023-10-19 | Discharge: 2023-10-19 | Discharge status: Against Medical Advice | Source: Home / Self Care

## 2023-10-19 ENCOUNTER — Other Ambulatory Visit: Payer: Self-pay

## 2023-10-19 DIAGNOSIS — Z5321 Procedure and treatment not carried out due to patient leaving prior to being seen by health care provider: Secondary | ICD-10-CM | POA: Insufficient documentation

## 2023-10-19 DIAGNOSIS — Z4689 Encounter for fitting and adjustment of other specified devices: Secondary | ICD-10-CM | POA: Insufficient documentation

## 2023-10-19 NOTE — ED Triage Notes (Signed)
 Patient here requesting a cast for his L hand . States the cast was cracked at work and then removed it the rest of the way. Was offered a splint here however states that his orthopedic doctor from [facility] advised returning for a cast tonight.

## 2023-10-19 NOTE — ED Notes (Signed)
 Pt yelling at staff and providers , cut his cast off in the lobby , pt offered a splint in the ED , ortho tech came down to place  splint , pt walked out
